# Patient Record
Sex: Female | Born: 1955 | Race: White | Hispanic: No | Marital: Married | Smoking: Former smoker
Health system: Southern US, Community
[De-identification: ages and names within clinical notes are randomized; demographics above are authoritative.]

---

## 2015-10-28 DIAGNOSIS — I10 Essential (primary) hypertension: Secondary | ICD-10-CM | POA: Insufficient documentation

## 2015-10-28 DIAGNOSIS — E785 Hyperlipidemia, unspecified: Secondary | ICD-10-CM | POA: Insufficient documentation

## 2015-10-28 DIAGNOSIS — G4700 Insomnia, unspecified: Secondary | ICD-10-CM | POA: Insufficient documentation

## 2018-04-28 ENCOUNTER — Ambulatory Visit
Admission: RE | Admit: 2018-04-28 | Discharge: 2018-04-28 | Disposition: A | Discharge status: Home / Self Care | Payer: Self-pay | Source: Ambulatory Visit | Attending: Family Medicine | Admitting: Family Medicine

## 2018-04-28 ENCOUNTER — Other Ambulatory Visit: Payer: Self-pay | Admitting: Family Medicine

## 2018-04-28 ENCOUNTER — Ambulatory Visit
Admission: RE | Admit: 2018-04-28 | Discharge: 2018-04-28 | Disposition: A | Discharge status: Home / Self Care | Payer: No Typology Code available for payment source | Source: Ambulatory Visit | Attending: Family Medicine | Admitting: Family Medicine

## 2018-04-28 DIAGNOSIS — S43401A Unspecified sprain of right shoulder joint, initial encounter: Secondary | ICD-10-CM

## 2018-05-11 ENCOUNTER — Ambulatory Visit (INDEPENDENT_AMBULATORY_CARE_PROVIDER_SITE_OTHER): Payer: Worker's Compensation | Admitting: Physician Assistant

## 2018-05-11 DIAGNOSIS — M25511 Pain in right shoulder: Secondary | ICD-10-CM | POA: Diagnosis not present

## 2018-05-11 DIAGNOSIS — M792 Neuralgia and neuritis, unspecified: Secondary | ICD-10-CM

## 2018-05-11 MED ORDER — METHYLPREDNISOLONE ACETATE 40 MG/ML IJ SUSP
40.0000 mg | INTRAMUSCULAR | Status: AC | PRN
  Administered 2018-05-11: 40 mg via INTRA_ARTICULAR

## 2018-05-11 MED ORDER — LIDOCAINE HCL 1 % IJ SOLN
3.0000 mL | INTRAMUSCULAR | Status: AC | PRN
  Administered 2018-05-11: 3 mL

## 2018-05-11 MED ORDER — GABAPENTIN 300 MG PO CAPS: 300.0000 mg | ORAL_CAPSULE | Freq: Every day | ORAL | 1 refills | Status: DC

## 2018-05-11 NOTE — Progress Notes (Signed)
Office Visit Note   Patient: Kimberly Marshall           Date of Birth: 17-Apr-1956           MRN: [id number] Visit Date: 05/11/2018              Requested by: Richmond CampbellKaplan, Kristen W., PA-C [address] [place], [place] [id number] PCP: Patient, No Pcp Per   Assessment & Plan: Visit Diagnoses:  1. Acute pain of right shoulder   2. Radicular pain in right arm     Plan: She is given shoulder exercise handouts knees are reviewed with her today.  We will have her undergo EMG nerve conduction studies to rule out brachial plexus injury or radial nerve injury.  Have her take Aleve 2 tablets in the morning 2 tablets in the evening with food.  We will start her on Neurontin due to the neuropathic pain she is having paresthesia of the right forearm.  Follow-up after the EMG nerve conduction studies to go over results and discuss further treatment.  Regards to work she can can continue work with the following restriction of no lifting the right arm greater than 5 pounds.  Follow-Up Instructions: Return for after EMG nerve conduction studies.   Orders:  Orders Placed This Encounter  Procedures  . Large Joint Inj: R subacromial bursa   Meds ordered this encounter  Medications  . gabapentin (NEURONTIN) 300 MG capsule    Sig: Take 1 capsule (300 mg total) by mouth at bedtime.    Dispense:  30 capsule    Refill:  1      Procedures: Large Joint Inj: R subacromial bursa on 05/11/2018 3:11 PM Indications: pain Details: 22 G 1.5 in needle, lateral approach  Arthrogram: No  Medications: 3 mL lidocaine 1 %; 40 mg methylPREDNISolone acetate 40 MG/ML Outcome: tolerated well, no immediate complications Procedure, treatment alternatives, risks and benefits explained, specific risks discussed. Consent was given by the patient. Patient was prepped and draped in the usual sterile fashion.       Clinical Data: No additional findings.   Subjective: Chief Complaint  Patient presents with  . Right  Shoulder - Pain    Injured shoulder 04/09/18 while lifting something at work.    HPI Kimberly Marshall is a 62 year old female workman's comp patient who works at [employer].  She reports on 04/09/2018 she was at work and lifted a 50 pound bag of line per customer initially had some pain in her shoulder and thought this would go away within a week later she started developing some numbness burning sensation in her forearm and down into her right thumb.  She is right-hand dominant.  Saw her primary care physician who placed her on a Medrol Dosepak which gave her no relief.  She also had radiographs of her right shoulder to have is able to review today and showed no acute fractures: Shoulder to be well located.  No bony abnormalities.  She is also tried some tramadol which helps some with the pain.  She notes that whenever she is trying to write that her thumb does not seem as strong as it has in the past and she has difficulty writing.  She also notes that she has pain in the shoulder particularly with trying to do any overhead activities.  Right arm with full extension causes the numbness to be worse anterior radial side of the forearm down into the thumb.  Review of Systems See HPI  Objective:  Vital Signs: There were no vitals taken for this visit.  Physical Exam  Constitutional: She is oriented to person, place, and time. She appears well-developed and well-nourished. No distress.  Cardiovascular: Intact distal pulses.  Pulmonary/Chest: Effort normal.  Neurological: She is alert and oriented to person, place, and time.  Skin: Skin is warm and dry. No rash noted. She is not diaphoretic. No erythema.  Psychiatric: She has a normal mood and affect.    Ortho Exam Bilateral shoulders she has 5 out of 5 strength with external and internal rotation.  Right shoulder intermittent.  She is able to bring her right arm above her head but has to ratchet it up.  No tenderness about the shoulder girdle including  the medial border of the right scapula.  Nontender bicipital groove no sign of biceps rupture.  Distal biceps is intact bilaterally.  She has 5 out of 5 strength throughout the upper extremities except for pinch test which is 4 out of 5 on the right compared to 5 out of 5 on the left.  She has subjective decreased sensation in the radial anterior aspect of the forearm to light touch.  Full motor bilateral hands and full sensation bilateral hands light touch.  Radial pulses are intact bilaterally.  Cervical spine she has full range of motion cervical spine without pain.  Nontender about the cervical spinal column paraspinous region.  Negative Spurling's.  Specialty Comments:  No specialty comments available.  Imaging: No results found.   PMFS History: Patient Active Problem List   Diagnosis Date Noted  . Acute pain of right shoulder 05/11/2018  . Radicular pain in right arm 05/11/2018    Social History   Occupational History  . Not on file  Tobacco Use  . Smoking status: Not on file  Substance and Sexual Activity  . Alcohol use: Not on file  . Drug use: Not on file  . Sexual activity: Not on file

## 2018-05-12 ENCOUNTER — Other Ambulatory Visit (INDEPENDENT_AMBULATORY_CARE_PROVIDER_SITE_OTHER): Payer: Self-pay

## 2018-05-12 DIAGNOSIS — M25511 Pain in right shoulder: Secondary | ICD-10-CM

## 2018-05-16 ENCOUNTER — Telehealth (INDEPENDENT_AMBULATORY_CARE_PROVIDER_SITE_OTHER): Payer: Self-pay | Admitting: Physician Assistant

## 2018-05-16 NOTE — Telephone Encounter (Signed)
Advised case mgr. I had faxed all info to her this morning but she says it takes 24 hrs to get her faxes. Adv her that Dr, Alvester Morin could do nerve study and injection. She will s/w adj and get back to me.

## 2018-05-16 NOTE — Telephone Encounter (Signed)
.  Kimberly Marshall

## 2018-05-16 NOTE — Telephone Encounter (Signed)
Kimberly Marshall, Nurse Case Manager with [employer] left a message in regards to this patient regarding the nerve testing Kimberly Marshall wants done.  She is requesting a VO for the testing so that they can get her scheduled.  CB#[phone].  Thank you.

## 2018-05-23 ENCOUNTER — Encounter: Payer: Self-pay | Admitting: Physician Assistant

## 2018-05-29 ENCOUNTER — Ambulatory Visit (INDEPENDENT_AMBULATORY_CARE_PROVIDER_SITE_OTHER): Payer: Worker's Compensation | Admitting: Physician Assistant

## 2018-05-29 ENCOUNTER — Encounter (INDEPENDENT_AMBULATORY_CARE_PROVIDER_SITE_OTHER): Payer: Self-pay | Admitting: Physician Assistant

## 2018-05-29 DIAGNOSIS — R202 Paresthesia of skin: Secondary | ICD-10-CM | POA: Insufficient documentation

## 2018-05-29 NOTE — Progress Notes (Signed)
Office Visit Note   Patient: Kimberly Marshall           Date of Birth: February 21, 1956           MRN: [id number] Visit Date: 05/29/2018              Requested by: No referring provider defined for this encounter. PCP: Patient, No Pcp Per   Assessment & Plan: Visit Diagnoses:  1. Paresthesia of arm     Plan: Due to her continued pain and weakness in the right hand especially thumb with a normal electrodiagnostic study recommend that she be seen by neurology.  Follow-Up Instructions: Return if symptoms worsen or fail to improve.   Orders:  No orders of the defined types were placed in this encounter.  No orders of the defined types were placed in this encounter.     Procedures: No procedures performed   Clinical Data: No additional findings.   Subjective: Chief Complaint  Patient presents with  . Right Shoulder - Pain    HPI Ms. [name] returns today to review her electrodiagnostic study of her right upper arm.  Again this is a workman's comp case.  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  She continues to have pain in the anterior aspect of her forearm into her thumb she describes a burning pain.  States overall her shoulder feels better since doing exercises in also having any subacromial injection.  She continues to have decreased sensation over the volar aspect of her forearm into the thumb to light touch.  She states that she is now able to raise her right arm above her head easily.  However if her right forearm is hanging down her pain in the forearm and thumb overall increase.  She did start taking gabapentin and actually increased it to twice a day on her own and feels that this is helped some with the pain in her arm. Electrodiagnostic testing of the right upper extremity showed no evidence of radiculopathy, plexopathy or radicular neuropathy.  Review of Systems Please see HPI otherwise negative  Objective: Vital  Signs: There were no vitals taken for this visit.  Physical Exam  Constitutional: She is oriented to person, place, and time. She appears well-developed and well-nourished. No distress.  Pulmonary/Chest: Effort normal.  Neurological: She is alert and oriented to person, place, and time.  Skin: She is not diaphoretic.  Psychiatric: She has a normal mood and affect.    Ortho Exam Bilateral shoulders 5 out of 5 strength with external and internal rotation against resistance.  Slight positive impingement test on the right, negative on the left.  Nontender tender about the right shoulder girdle.  Empty can test negative bilaterally.  Full motor bilateral hands.  4 out of 5 pinch strength on the right compared to 5 out of 5 on the left.  Decreased sensation subjectively right distal volar forearm medial aspect.  Negative Tinel's at the wrist and compression test over the median nerve bilaterally.  Full range of motion of the cervical spine without pain. Specialty Comments:  No specialty comments available.  Imaging: No results found.   PMFS History: Patient Active Problem List   Diagnosis Date Noted  . Paresthesia of arm 05/29/2018  . Acute pain of right shoulder 05/11/2018  . Radicular pain in right arm 05/11/2018   History reviewed. No pertinent past medical history.  History reviewed. No pertinent family history.  History  reviewed. No pertinent surgical history. Social History   Occupational History  . Not on file  Tobacco Use  . Smoking status: Never Smoker  . Smokeless tobacco: Never Used  Substance and Sexual Activity  . Alcohol use: Not on file  . Drug use: Not on file  . Sexual activity: Not on file

## 2018-05-30 ENCOUNTER — Other Ambulatory Visit (INDEPENDENT_AMBULATORY_CARE_PROVIDER_SITE_OTHER): Payer: Self-pay

## 2018-05-30 DIAGNOSIS — M79601 Pain in right arm: Secondary | ICD-10-CM

## 2018-06-02 ENCOUNTER — Telehealth (INDEPENDENT_AMBULATORY_CARE_PROVIDER_SITE_OTHER): Payer: Self-pay | Admitting: Physician Assistant

## 2018-06-02 NOTE — Telephone Encounter (Signed)
Holding for you.  Per your last note, patient increased her dose of Gabapentin on her own.  She is being referred to neurology.

## 2018-06-02 NOTE — Telephone Encounter (Signed)
Patient called stating that Kimberly Marshall was suppose to call in a prescription for Gabapentin due to the fact that he wanted her to increase her dosage from 1 to pills at night.  She stated that she has a few, but will run out.  CB#[phone]

## 2018-06-05 ENCOUNTER — Other Ambulatory Visit (INDEPENDENT_AMBULATORY_CARE_PROVIDER_SITE_OTHER): Payer: Self-pay | Admitting: Physician Assistant

## 2018-06-05 MED ORDER — GABAPENTIN 300 MG PO CAPS: 300.0000 mg | ORAL_CAPSULE | Freq: Two times a day (BID) | ORAL | 1 refills | Status: DC

## 2018-06-05 NOTE — Telephone Encounter (Signed)
I called patient and advised. 

## 2018-06-05 NOTE — Telephone Encounter (Signed)
Sent rx in

## 2018-06-12 ENCOUNTER — Telehealth (INDEPENDENT_AMBULATORY_CARE_PROVIDER_SITE_OTHER): Payer: Self-pay

## 2018-06-12 NOTE — Telephone Encounter (Signed)
  Received the following email from the case mgr regarding the neurologist referral. Please advise  I have called around within 100 miles to find a Neurologist for Kimberly Marshall. I have not been able to find one who will take workers compensation. I think at this point your office is going to have to have Richardean Canal talk to one of the Neurologists you normally refer to and see if they will take work comp or see if you can get someone from the hospital neurology clinic to see her under work comp or she is going to have to come back to you. I called [facility] and faxed them her records last week and I spoke with them this morning and was advised they will only see work comp for headaches and that the hospital doesn't even take work comp for Neurology. I find that hard to believe. I will follow-up with [name] this morning and let her know she is going to have to get back with you all about this but please let me know how you want to proceed.  Zara Council, RN, CCM Nurse Case Manager [employer]  [address] [address] [place], [place] [address] P [phone] ext. [phone] F [phone] [email or web address]

## 2018-06-12 NOTE — Telephone Encounter (Signed)
Please advise. Thanks.  

## 2018-06-13 NOTE — Telephone Encounter (Signed)
Per Gil's response, work comp needs to find neurologist.

## 2018-06-13 NOTE — Telephone Encounter (Signed)
Workers comp needs to find her a Insurance account manager

## 2018-06-14 NOTE — Telephone Encounter (Signed)
Case mgr is asking if she can get an order for an MRI since she hasn't had one yet. If her MRI is normal then they will request an IME because finding a Neurologist was attempted and none will take workers Sport and exercise psychologist.

## 2018-06-16 ENCOUNTER — Other Ambulatory Visit (INDEPENDENT_AMBULATORY_CARE_PROVIDER_SITE_OTHER): Payer: Self-pay | Admitting: Radiology

## 2018-06-16 DIAGNOSIS — M542 Cervicalgia: Secondary | ICD-10-CM

## 2018-06-16 NOTE — Telephone Encounter (Signed)
Emailed orders to [employer] ([email or web address])

## 2018-06-16 NOTE — Telephone Encounter (Signed)
Can you see if Bronson Curb will address the previous message next week

## 2018-06-16 NOTE — Telephone Encounter (Signed)
Spoke with Dr. Magnus Ivan he said MRI her cervical spine to r/o HNP and her shoulder to evaluate internal derangement

## 2018-06-22 ENCOUNTER — Telehealth (INDEPENDENT_AMBULATORY_CARE_PROVIDER_SITE_OTHER): Payer: Self-pay | Admitting: Orthopaedic Surgery

## 2018-06-22 NOTE — Telephone Encounter (Signed)
Inetta Fermo from [facility] called stating that she received the same order back.  She stated that the order needs to be either signed and faxed or electronically signed and faxed.  Please fax directly to her at [phone].  CB#[phone]  Thank you.

## 2018-06-23 NOTE — Telephone Encounter (Signed)
Orders signature stamped and faxed to number given

## 2018-06-23 NOTE — Telephone Encounter (Signed)
I think this may be for you. 

## 2018-07-03 ENCOUNTER — Telehealth (INDEPENDENT_AMBULATORY_CARE_PROVIDER_SITE_OTHER): Payer: Self-pay

## 2018-07-03 ENCOUNTER — Encounter (INDEPENDENT_AMBULATORY_CARE_PROVIDER_SITE_OTHER): Payer: Self-pay | Admitting: Physician Assistant

## 2018-07-03 ENCOUNTER — Ambulatory Visit (INDEPENDENT_AMBULATORY_CARE_PROVIDER_SITE_OTHER): Payer: Worker's Compensation | Admitting: Physician Assistant

## 2018-07-03 VITALS — Ht 65.0 in | Wt 172.0 lb

## 2018-07-03 DIAGNOSIS — S46011D Strain of muscle(s) and tendon(s) of the rotator cuff of right shoulder, subsequent encounter: Secondary | ICD-10-CM | POA: Diagnosis not present

## 2018-07-03 DIAGNOSIS — R202 Paresthesia of skin: Secondary | ICD-10-CM

## 2018-07-03 NOTE — Progress Notes (Signed)
Office Visit Note   Patient: Kimberly Marshall           Date of Birth: [birth year]           MRN: [id number] Visit Date: 07/03/2018              Requested by: No referring provider defined for this encounter. PCP: Patient, No Pcp Per   Assessment & Plan: Visit Diagnoses:  1. Traumatic complete tear of right rotator cuff, subsequent encounter   2. Cervicalgia     Plan: Due to patient's continued pain in the right shoulder and the MRI findings recommend right shoulder arthroscopy with possible rotator cuff repair.  Discussed with her risk benefits of surgery which include but are not limited to prolonged pain, worsening pain, inability to repair the rotator cuff tear, infection, wound healing problems, nerve or vessel injury.  She would like to proceed with surgery in the near future we will have to obtain approval from Workmen's Comp. prior to scheduling her surgery.  In regards to her neck due to the fact that she had normal EMG nerve conduction studies and has mild right foraminal narrowing at C6-C7 with recommend foraminal injection on the right at C6-C7 for diagnostic and hopefully therapeutic purposes.  Again this will need to be approved by [employer]. prior to scheduling.  Questions were encouraged and answered at length.  We will see her back 1 week postop.  Follow-Up Instructions: Return for Dr. Magnus Ivan one week post-op.   Orders:  No orders of the defined types were placed in this encounter.  No orders of the defined types were placed in this encounter.     Procedures: No procedures performed   Clinical Data: No additional findings.   Subjective: Chief Complaint  Patient presents with  . Right Shoulder - Follow-up  . Spine - Follow-up    W/C OTJI 04/09/18    HPI [name] returns today to go over the MRI of her right shoulder and cervical spine.  Again she is Workmen's Comp. she is status post injury 04/09/2018 to the right upper extremity.  She states that her  right shoulder pain is now returning.  She did have good relief after the cortisone injection at the initial office visit on 05/29/2018.  She continues to have numbness in the right forearm. MRI of the cervical spine is reviewed with patient.  Showed mild to moderate cervical spondylosis and facet joint arthrosis.  C3-4 small central disc protrusion without significant spinal or foraminal stenosis.  C6-C7 mild bilateral foraminal stenosis. MRI of the right shoulder showed a full-thickness supraspinatus tear with a 1.5 cm of retraction.  Mild AC joint degenerative changes.  Infraspinatus partial-thickness tearing.  Review of Systems   Objective: Vital Signs: Ht 5\' 5"  (1.651 m)   Wt 172 lb (78 kg)   BMI 28.62 kg/m   Physical Exam  Constitutional: She is oriented to person, place, and time. She appears well-developed and well-nourished. No distress.  Pulmonary/Chest: Effort normal.  Neurological: She is alert and oriented to person, place, and time.  Skin: She is not diaphoretic.  Psychiatric: She has a normal mood and affect.    Ortho Exam Positive impingement right shoulder.  Internal rotation against resistance 5 out of 5 strength bilaterally external rotation 4 of the right 5 out of 5 on the left against resistance.  Empty can test positive on the right negative on the left liftoff test is positive on the right negative on the left.  Continue subjective numbness right forearm volar thumb side. Specialty Comments:  No specialty comments available.  Imaging: No results found.   PMFS History: Patient Active Problem List   Diagnosis Date Noted  . Paresthesia of arm 05/29/2018  . Acute pain of right shoulder 05/11/2018  . Radicular pain in right arm 05/11/2018   No past medical history on file.  No family history on file.  No past surgical history on file. Social History   Occupational History  . Not on file  Tobacco Use  . Smoking status: Never Smoker  . Smokeless tobacco:  Never Used  Substance and Sexual Activity  . Alcohol use: Not on file  . Drug use: Not on file  . Sexual activity: Not on file

## 2018-07-03 NOTE — Telephone Encounter (Signed)
Emailed todays office note and surgery request to case mgr Zara Council @ [employer] per her request

## 2018-07-04 ENCOUNTER — Other Ambulatory Visit (INDEPENDENT_AMBULATORY_CARE_PROVIDER_SITE_OTHER): Payer: Self-pay

## 2018-07-04 DIAGNOSIS — M542 Cervicalgia: Secondary | ICD-10-CM

## 2018-07-13 ENCOUNTER — Telehealth (INDEPENDENT_AMBULATORY_CARE_PROVIDER_SITE_OTHER): Payer: Self-pay

## 2018-07-13 NOTE — Telephone Encounter (Signed)
I received notification from Francia Greaves the adjuster who has now been assigned to the claim that she is denying the claim going forward so the surgery and further treatment has not been approved. I will discuss it with Sheridyn so she can go to her manager and HR. I'll let you know if it gets reversed. Thanks.  Zara Council, RN, CCM Nurse Case Manager [employer]  [address] [address] [place], [place] [address] P [phone]. [phone] F [phone] [email or web address]

## 2018-07-17 ENCOUNTER — Other Ambulatory Visit (INDEPENDENT_AMBULATORY_CARE_PROVIDER_SITE_OTHER): Payer: Self-pay | Admitting: Physician Assistant

## 2018-07-17 NOTE — Telephone Encounter (Signed)
Please advise 

## 2018-07-28 ENCOUNTER — Telehealth (INDEPENDENT_AMBULATORY_CARE_PROVIDER_SITE_OTHER): Payer: Self-pay

## 2018-07-28 NOTE — Telephone Encounter (Signed)
Left voicemail asking that adjustor fax us a copy of the work comp denial letter

## 2018-08-02 ENCOUNTER — Other Ambulatory Visit (INDEPENDENT_AMBULATORY_CARE_PROVIDER_SITE_OTHER): Payer: Self-pay | Admitting: Orthopaedic Surgery

## 2018-08-02 ENCOUNTER — Telehealth (INDEPENDENT_AMBULATORY_CARE_PROVIDER_SITE_OTHER): Payer: Self-pay | Admitting: Orthopaedic Surgery

## 2018-08-02 NOTE — Telephone Encounter (Signed)
Please advise 

## 2018-08-02 NOTE — Telephone Encounter (Signed)
Please advise if this is ok.  

## 2018-08-02 NOTE — Telephone Encounter (Signed)
Patient called advised her employer need a note stating she can not work. Patient said the last day that she worked was Monday 07/31/18. Patient said she can not work with her right shoulder  Hurting as bad as it is. Patient said she work for [employer] company in [place].  Patient said she will pick up note tomorrow. The number to contact patient is [phone]

## 2018-08-02 NOTE — Telephone Encounter (Signed)
It is okay to give her a note to keep her out of work due to her shoulder pain.  Apparently we have not seen her since September.  However she finally did get an MRI of her shoulder and cervical spine that was done in October but for some reason she has not been seen in follow-up since then.  She needs a follow-up appointment so we can go over the MRI of her shoulder and her cervical spine.

## 2018-08-03 ENCOUNTER — Telehealth (INDEPENDENT_AMBULATORY_CARE_PROVIDER_SITE_OTHER): Payer: Self-pay | Admitting: Orthopaedic Surgery

## 2018-08-03 ENCOUNTER — Encounter (INDEPENDENT_AMBULATORY_CARE_PROVIDER_SITE_OTHER): Payer: Self-pay

## 2018-08-03 NOTE — Telephone Encounter (Signed)
Patient walked in to check the status of the out of work note. Pt states she last saw Kimberly Marshall in October after the MRI & was scheduled to have surgery but W/C denied surgery. Then it was decided to have the sx using her medical insurance.   Pt request a call  Back at [phone]

## 2018-08-03 NOTE — Telephone Encounter (Signed)
Patient aware note is at front desk 

## 2018-08-14 ENCOUNTER — Telehealth (INDEPENDENT_AMBULATORY_CARE_PROVIDER_SITE_OTHER): Payer: Self-pay | Admitting: Orthopaedic Surgery

## 2018-08-14 ENCOUNTER — Other Ambulatory Visit (INDEPENDENT_AMBULATORY_CARE_PROVIDER_SITE_OTHER): Payer: Self-pay | Admitting: Orthopaedic Surgery

## 2018-08-14 MED ORDER — OXYCODONE HCL 5 MG PO CAPS: 5.0000 mg | ORAL_CAPSULE | ORAL | 0 refills | Status: AC | PRN

## 2018-08-14 NOTE — Telephone Encounter (Signed)
[facility]

## 2018-08-14 NOTE — Telephone Encounter (Signed)
Her surgery is scheduled for 08-17-18 at [facility].

## 2018-08-14 NOTE — Telephone Encounter (Signed)
Find out when the surgery is going to be first.

## 2018-08-14 NOTE — Telephone Encounter (Signed)
Patient wanted to know if she could get her pain medication called into her pharmacy before she has the surgery so that she can go ahead and pick it up instead of waiting til the day of surgery.  CB#[phone].  Thank you.

## 2018-08-14 NOTE — Telephone Encounter (Signed)
when's her surgery?

## 2018-08-14 NOTE — Telephone Encounter (Signed)
Please advise 

## 2018-08-14 NOTE — Telephone Encounter (Signed)
I sent some oxycodone in to the [facility] in [place].

## 2018-08-17 DIAGNOSIS — S46011D Strain of muscle(s) and tendon(s) of the rotator cuff of right shoulder, subsequent encounter: Secondary | ICD-10-CM

## 2018-08-21 ENCOUNTER — Other Ambulatory Visit (INDEPENDENT_AMBULATORY_CARE_PROVIDER_SITE_OTHER): Payer: Self-pay | Admitting: Orthopaedic Surgery

## 2018-08-21 NOTE — Telephone Encounter (Signed)
Please advise 

## 2018-08-24 ENCOUNTER — Ambulatory Visit (INDEPENDENT_AMBULATORY_CARE_PROVIDER_SITE_OTHER): Payer: No Typology Code available for payment source | Admitting: Physician Assistant

## 2018-08-24 ENCOUNTER — Encounter (INDEPENDENT_AMBULATORY_CARE_PROVIDER_SITE_OTHER): Payer: Self-pay | Admitting: Physician Assistant

## 2018-08-24 DIAGNOSIS — M542 Cervicalgia: Secondary | ICD-10-CM

## 2018-08-24 MED ORDER — GABAPENTIN 300 MG PO CAPS: 300.0000 mg | ORAL_CAPSULE | Freq: Two times a day (BID) | ORAL | 1 refills | Status: DC

## 2018-08-24 NOTE — Progress Notes (Signed)
HPI: [name] returns today 1 week status post right shoulder arthroscopy.  She underwent a subacromial decompression with partial acromioplasty without coracoid acromial release on the right side.  There was a small tear of the leading edge of the supraspinatus tendon but this was not repaired due to the fact there was not a full-thickness.  States that she is not having much pain in the right shoulder at this point time. Patient continues to have numbness in her right thumb.  Again she underwent a EMG nerve conduction studies upper extremities which were normal.  MRI of her cervical spine showed showed mild bilateral foraminal stenosis at C6-C7.  Physical exam right shoulder: Port sites are well approximated with nylon sutures no signs of infection.  She has good range of motion of the right elbow forearm wrist and hand.  Impression: Status post right shoulder arthroscopy 1 week Paresthesia right thumb  Plan: This point time patient is filing everything under her personal insurance therefore I recommend again for diagnostic and hopefully therapeutic purposes right C6-C7 foraminal injection with Dr. Alvester MorinNewton.  We will see her back in a month in regards to her shoulder and also to see what type of response she had to the epidural steroid injection with Dr. Alvester MorinNewton.  Sutures were removed from the shoulder today.  She will get the incisions wet.  She is given prescription for physical therapy to work on range of motion of the shoulder and strengthening shoulder.

## 2018-08-29 ENCOUNTER — Telehealth (INDEPENDENT_AMBULATORY_CARE_PROVIDER_SITE_OTHER): Payer: Self-pay | Admitting: Orthopaedic Surgery

## 2018-08-29 ENCOUNTER — Telehealth (INDEPENDENT_AMBULATORY_CARE_PROVIDER_SITE_OTHER): Payer: Self-pay | Admitting: *Deleted

## 2018-08-29 NOTE — Telephone Encounter (Signed)
08/17/18 [facility] [phone]

## 2018-09-14 ENCOUNTER — Other Ambulatory Visit (INDEPENDENT_AMBULATORY_CARE_PROVIDER_SITE_OTHER): Payer: Self-pay | Admitting: Physical Medicine and Rehabilitation

## 2018-09-14 DIAGNOSIS — F411 Generalized anxiety disorder: Secondary | ICD-10-CM

## 2018-09-14 MED ORDER — DIAZEPAM 5 MG PO TABS: ORAL_TABLET | ORAL | 0 refills | Status: DC

## 2018-09-14 NOTE — Progress Notes (Signed)
Pre-procedure diazepam ordered for pre-operative anxiety.  

## 2018-09-14 NOTE — Telephone Encounter (Signed)
Sent rx.

## 2018-09-21 ENCOUNTER — Encounter (INDEPENDENT_AMBULATORY_CARE_PROVIDER_SITE_OTHER): Payer: Self-pay | Admitting: Orthopaedic Surgery

## 2018-09-21 ENCOUNTER — Ambulatory Visit (INDEPENDENT_AMBULATORY_CARE_PROVIDER_SITE_OTHER): Payer: 59 | Admitting: Orthopaedic Surgery

## 2018-09-21 DIAGNOSIS — Z9889 Other specified postprocedural states: Secondary | ICD-10-CM

## 2018-09-21 NOTE — Progress Notes (Signed)
The patient is now about 6-week status post a right shoulder arthroscopy with extensive debridement and subacromial decompression.  We did find a tear of the rotator cuff at the anterior leading edge of the rotator cuff and we did not need to repair it.  At the time of surgery we found mainly just significant inflamed tissue in the shoulder itself but the majority the rotator cuff was intact and not in need of repair.  She is actually scheduled to have an intervention in her cervical spine under fluoroscopy by Dr. Alvester Morin tomorrow.  She is working with physical therapy trying to progress her shoulder strength.  She is out of work until further notice due to this being her dominant arm and the disability that is caused temporarily.  On exam she still has significant weakness and decreased motion of the right shoulder.  We will still need to keep her out of work until further notice.  She will continue aggressive physical therapy on her right shoulder.  She will have her intervention in the cervical spine by Dr. Alvester Morin tomorrow.  We will see her back in 4 weeks to see how she is doing overall.

## 2018-09-22 ENCOUNTER — Ambulatory Visit (INDEPENDENT_AMBULATORY_CARE_PROVIDER_SITE_OTHER): Payer: Self-pay

## 2018-09-22 ENCOUNTER — Ambulatory Visit (INDEPENDENT_AMBULATORY_CARE_PROVIDER_SITE_OTHER): Payer: 59 | Admitting: Physical Medicine and Rehabilitation

## 2018-09-22 ENCOUNTER — Encounter (INDEPENDENT_AMBULATORY_CARE_PROVIDER_SITE_OTHER): Payer: Self-pay | Admitting: Physical Medicine and Rehabilitation

## 2018-09-22 VITALS — BP 130/79 | HR 75 | Temp 97.8°F

## 2018-09-22 DIAGNOSIS — M5412 Radiculopathy, cervical region: Secondary | ICD-10-CM | POA: Diagnosis not present

## 2018-09-22 MED ORDER — METHYLPREDNISOLONE ACETATE 80 MG/ML IJ SUSP
80.0000 mg | Freq: Once | INTRAMUSCULAR | Status: AC
  Administered 2018-09-22: 80 mg

## 2018-09-22 NOTE — Patient Instructions (Signed)

## 2018-09-22 NOTE — Progress Notes (Signed)
.  Numeric Pain Rating Scale and Functional Assessment Average Pain 3   In the last MONTH (on 0-10 scale) has pain interfered with the following?  1. General activity like being  able to carry out your everyday physical activities such as walking, climbing stairs, carrying groceries, or moving a chair?  Rating(4)   +Driver, -BT, -Dye Allergies.  

## 2018-09-25 NOTE — Progress Notes (Signed)
Kimberly ChandlerLynn Som - 63 y.o. female MRN [id number]  Date of birth: 1956/08/16  Office Visit Note: Visit Date: 09/22/2018 PCP: Patient, No Pcp Per Referred by: No ref. provider found  Subjective: Chief Complaint  Patient presents with  . Neck - Pain  . Right Arm - Pain  . Right Hand - Numbness, Tingling   HPI: Kimberly Marshall is a 63 y.o. female who comes in today At the request of Rexene EdisonGil Clark, PA-C for C7-T1 interlaminar epidural steroid injection for right radicular pain.  Patient has been having neck and shoulder pain with referring pattern of the right arm down to the right thumb and somewhat of a C6 distribution.  She had electrodiagnostic study performed by Dr. Manon HildingBodea which was fairly normal without sign of acute radiculopathy or other nerve compression.  Symptoms started August 2019.  Has been taking medications including gabapentin.  Reports average pain is a 3 out of 10.  MRI showed a small central protrusion at C3-4 and spondylosis and mild foraminal narrowing at C6-7.  Bronson CurbGil had talked about C6-7 foraminal injection but her symptoms are clearly more C6 distributed in her thumb.  Nerve root at C6-7 would actually be the C7 nerve root.  Discussed with the patient the risk the benefit of nerve root block in the cervical spine and I do not feel like that is a warranted wrist to take at this point.  I do think epidural injection that would be worthwhile and she does want to proceed.  ROS Otherwise per HPI.  Assessment & Plan: Visit Diagnoses:  1. Cervical radiculopathy     Plan: No additional findings.   Meds & Orders:  Meds ordered this encounter  Medications  . methylPREDNISolone acetate (DEPO-MEDROL) injection 80 mg    Orders Placed This Encounter  Procedures  . XR C-ARM NO REPORT  . Epidural Steroid injection    Follow-up: Return if symptoms worsen or fail to improve, for Consider C6 transforaminal nerve root block.   Procedures: No procedures performed  Cervical Epidural Steroid  Injection - Interlaminar Approach with Fluoroscopic Guidance  Patient: Kimberly Marshall      Date of Birth: 1956/08/16 MRN: [id number] PCP: Patient, No Pcp Per      Visit Date: 09/22/2018   Universal Protocol:    Date/Time: 01/13/205:35 AM  Consent Given By: the patient  Position: PRONE  Additional Comments: Vital signs were monitored before and after the procedure. Patient was prepped and draped in the usual sterile fashion. The correct patient, procedure, and site was verified.   Injection Procedure Details:  Procedure Site One Meds Administered:  Meds ordered this encounter  Medications  . methylPREDNISolone acetate (DEPO-MEDROL) injection 80 mg     Laterality: Right  Location/Site: C7-T1  Needle size: 20 G  Needle type: Touhy  Needle Placement: Paramedian epidural space  Findings:  -Comments: Excellent flow of contrast into the epidural space.  Procedure Details: Using a paramedian approach from the side mentioned above, the region overlying the inferior lamina was localized under fluoroscopic visualization and the soft tissues overlying this structure were infiltrated with 4 ml. of 1% Lidocaine without Epinephrine. A # 20 gauge, Tuohy needle was inserted into the epidural space using a paramedian approach.  The epidural space was localized using loss of resistance along with lateral and contralateral oblique bi-planar fluoroscopic views.  After negative aspirate for air, blood, and CSF, a 2 ml. volume of Isovue-250 was injected into the epidural space and the flow of contrast was observed. Radiographs  were obtained for documentation purposes.   The injectate was administered into the level noted above.  Additional Comments:  The patient tolerated the procedure well Dressing: Band-Aid    Post-procedure details: Patient was observed during the procedure. Post-procedure instructions were reviewed.  Patient left the clinic in stable condition.    Clinical  History: Electrodiagnostic study of the right upper limb performed at [facility] pain management by Dr. Wilhelmenia Blase showed normal electrodiagnostic study.  2019  Acute Interface, Incoming Rad Results - 06/28/2018 12:42 PM EDT MRI cervical spine without contrast:  INDICATION: Neck pain and right shoulder pain. Right arm weakness..  TECHNIQUE: Sagittal T1, T2, and STIR images were performed. Axial T2-weighted images.   COMPARISON: None available  FINDINGS: # Vertebral bodies: Normal height and alignment.  # Marrow signal: No significant abnormality. # Cervical spinal cord: No mass or abnormal signal intensity. No spinal cord compression. # Craniovertebral junction: Normal.  #   # C2-3: Normal. # C3-4: Mild degenerative disc disease with small uncovertebral joint spurs. Small central disc protrusion. Mild impingement on the thecal sac. Mild facet joint arthritis. No significant spinal or foraminal stenosis. # C4-5: Mild degenerative disc disease. Mild facet joint arthritis. No significant spinal or foraminal stenosis. # C5-6: Mild degenerative disc disease. No significant spinal or foraminal stenosis. # C6-7: Moderate degenerative disc disease. Mild diffuse posterior disc osteophyte complex. Mild bilateral foraminal stenosis. # C7-T1: Mild degenerative disc disease. No spinal or foraminal stenosis.   IMPRESSION:  1. Mild to moderate cervical spondylosis and facet joint arthrosis.  2. Small central disc protrusion C3-4. 3. 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   She reports that she has never smoked. She has never used smokeless tobacco. No results for input(s): HGBA1C, LABURIC in the last 8760 hours.  Objective:  VS:  HT:    WT:   BMI:     BP:130/79  HR:75bpm  TEMP:97.8 F (36.6 C)(Oral)  RESP:  Physical Exam  Ortho Exam Imaging: No results found.  Past Medical/Family/Surgical/Social History: Medications & Allergies reviewed per EMR, new medications updated. Patient  Active Problem List   Diagnosis Date Noted  . Status post arthroscopy of right shoulder 09/21/2018  . Paresthesia of arm 05/29/2018  . Acute pain of right shoulder 05/11/2018  . Radicular pain in right arm 05/11/2018   History reviewed. No pertinent past medical history. History reviewed. No pertinent family history. History reviewed. No pertinent surgical history. Social History   Occupational History  . Not on file  Tobacco Use  . Smoking status: Never Smoker  . Smokeless tobacco: Never Used  Substance and Sexual Activity  . Alcohol use: Not on file  . Drug use: Not on file  . Sexual activity: Not on file

## 2018-09-25 NOTE — Procedures (Signed)
Cervical Epidural Steroid Injection - Interlaminar Approach with Fluoroscopic Guidance  Patient: Kimberly Marshall      Date of Birth: Apr 18, 1956 MRN: [id number] PCP: Patient, No Pcp Per      Visit Date: 09/22/2018   Universal Protocol:    Date/Time: 01/13/205:35 AM  Consent Given By: the patient  Position: PRONE  Additional Comments: Vital signs were monitored before and after the procedure. Patient was prepped and draped in the usual sterile fashion. The correct patient, procedure, and site was verified.   Injection Procedure Details:  Procedure Site One Meds Administered:  Meds ordered this encounter  Medications  . methylPREDNISolone acetate (DEPO-MEDROL) injection 80 mg     Laterality: Right  Location/Site: C7-T1  Needle size: 20 G  Needle type: Touhy  Needle Placement: Paramedian epidural space  Findings:  -Comments: Excellent flow of contrast into the epidural space.  Procedure Details: Using a paramedian approach from the side mentioned above, the region overlying the inferior lamina was localized under fluoroscopic visualization and the soft tissues overlying this structure were infiltrated with 4 ml. of 1% Lidocaine without Epinephrine. A # 20 gauge, Tuohy needle was inserted into the epidural space using a paramedian approach.  The epidural space was localized using loss of resistance along with lateral and contralateral oblique bi-planar fluoroscopic views.  After negative aspirate for air, blood, and CSF, a 2 ml. volume of Isovue-250 was injected into the epidural space and the flow of contrast was observed. Radiographs were obtained for documentation purposes.   The injectate was administered into the level noted above.  Additional Comments:  The patient tolerated the procedure well Dressing: Band-Aid    Post-procedure details: Patient was observed during the procedure. Post-procedure instructions were reviewed.  Patient left the clinic in stable  condition.

## 2018-10-19 ENCOUNTER — Encounter (INDEPENDENT_AMBULATORY_CARE_PROVIDER_SITE_OTHER): Payer: Self-pay | Admitting: Orthopaedic Surgery

## 2018-10-19 ENCOUNTER — Ambulatory Visit (INDEPENDENT_AMBULATORY_CARE_PROVIDER_SITE_OTHER): Payer: 59 | Admitting: Orthopaedic Surgery

## 2018-10-19 DIAGNOSIS — M792 Neuralgia and neuritis, unspecified: Secondary | ICD-10-CM

## 2018-10-19 DIAGNOSIS — M542 Cervicalgia: Secondary | ICD-10-CM

## 2018-10-19 DIAGNOSIS — Z9889 Other specified postprocedural states: Secondary | ICD-10-CM

## 2018-10-19 DIAGNOSIS — R202 Paresthesia of skin: Secondary | ICD-10-CM

## 2018-10-19 MED ORDER — METHYLPREDNISOLONE 4 MG PO TABS: ORAL_TABLET | ORAL | 0 refills | Status: DC

## 2018-10-19 MED ORDER — DICLOFENAC SODIUM 75 MG PO TBEC: 75.0000 mg | DELAYED_RELEASE_TABLET | Freq: Two times a day (BID) | ORAL | 3 refills | Status: AC | PRN

## 2018-10-19 NOTE — Progress Notes (Signed)
The patient is continue to follow-up status post a right shoulder arthroscopy with significant debridement subacromial decompression.  There was not any significant rotator cuff tear the need to be repaired.  She has had significant relief of her symptoms of her shoulder.  However she still complains of pain that is radicular nature going down her right arm that has not been relieved at all.  We actually sent her for a C7/T1 transforaminal injection by Dr. Alvester Morin and she said her symptoms are actually worse since then.  Her original injury occurred when she was lifting a 50 pound bag of dog food and it was work-related.  On exam she appears comfortable.  Her right arm moves normally.  Her shoulder is done much better overall.  She still has the pain in her forearm and proximal arm as well as hand.  Nerve conduction studies have been normal.  An MRI showed mild foraminal stenosis bilaterally at C7-T1.  I am at a loss of what else to try for her.  It does not sound like she needs any type of surgical intervention in her neck based on it was just mild foraminal stenosis.  I am concerned for the fact that the injection did not help her at all.  I want to have her try a 6-day steroid taper as well as diclofenac as an anti-inflammatory.  She continue Neurontin and will continue physical therapy.  We will will eventually need to potentially obtain a functional capacity evaluation after some work conditioning once we get her through the surgery from her shoulder.  I do not see her going back to work right now based on her continued right-sided radicular symptoms.

## 2018-10-24 ENCOUNTER — Other Ambulatory Visit (INDEPENDENT_AMBULATORY_CARE_PROVIDER_SITE_OTHER): Payer: Self-pay | Admitting: Physician Assistant

## 2018-10-24 NOTE — Telephone Encounter (Signed)
Please advise 

## 2018-11-06 ENCOUNTER — Encounter (INDEPENDENT_AMBULATORY_CARE_PROVIDER_SITE_OTHER): Payer: Self-pay | Admitting: Orthopaedic Surgery

## 2018-11-06 ENCOUNTER — Ambulatory Visit (INDEPENDENT_AMBULATORY_CARE_PROVIDER_SITE_OTHER): Payer: 59 | Admitting: Orthopaedic Surgery

## 2018-11-06 DIAGNOSIS — Z9889 Other specified postprocedural states: Secondary | ICD-10-CM

## 2018-11-06 DIAGNOSIS — R202 Paresthesia of skin: Secondary | ICD-10-CM

## 2018-11-06 DIAGNOSIS — M25511 Pain in right shoulder: Secondary | ICD-10-CM

## 2018-11-06 DIAGNOSIS — M542 Cervicalgia: Secondary | ICD-10-CM

## 2018-11-06 NOTE — Progress Notes (Signed)
The patient is continue to follow-up from injuries she sustained a work-related accident from something that landed on her right shoulder when she is lifting overhead as well.  She continues to complain of paresthesias in her right arm.  We have been uncertain as to the etiology of the paresthesias.  She is not a diabetic.  She has had nerve conduction studies of the right upper extremity were normal.  She had an MRI of the cervical spine that just showed some mild bilateral stenosis at C7-T1.  She did have some partial tearing of rotator cuff of the right shoulder and impingement and we did perform an arthroscopic intervention in December and she said the shoulder is doing great with no issues at all.  She still has her neck issues.  Otherwise she feels like she is doing better.  Her right shoulder is moving easily in fluid and full without a issues.  Her incisions of healed nicely.  Her rotator cuff is strong.  Her neck moves well.  She has excellent strength in her bilateral upper extremities with subjective pain rating down her arm into her hand with numbness and tingling.  The mild loss of what else to try for her.  I will let her return to light duty work with restrictions of no repetitive overhead activities and lifting no greater than 10 to 15 pounds for the next 3 months.  I would like to reevaluate her one more time in 3 months to see if she is had any symptom improvement at all because hopefully things will be getting better for her.  All question concerns were answered and addressed.  I have no other options as it relates to her neck given the mild foraminal stenosis there is not a surgical intervention that she needs and the nerve conduction studies do not show any nerve compression in the spine.  Injections have not helped either.  We will see how she is doing in 3 months from now.

## 2018-11-22 ENCOUNTER — Telehealth (INDEPENDENT_AMBULATORY_CARE_PROVIDER_SITE_OTHER): Payer: Self-pay | Admitting: Orthopaedic Surgery

## 2018-11-22 NOTE — Telephone Encounter (Signed)
[facility] [employer] called checking status of a request for records. I advised we didn't have one and aske her to re fax and I would take care of when it came in. I gave both fax #'s

## 2019-01-01 ENCOUNTER — Telehealth (INDEPENDENT_AMBULATORY_CARE_PROVIDER_SITE_OTHER): Payer: Self-pay | Admitting: Orthopaedic Surgery

## 2019-01-01 NOTE — Telephone Encounter (Signed)
Billing faxed to Tammy @ [facility]

## 2019-01-11 ENCOUNTER — Other Ambulatory Visit: Payer: Self-pay

## 2019-01-11 ENCOUNTER — Ambulatory Visit (INDEPENDENT_AMBULATORY_CARE_PROVIDER_SITE_OTHER): Payer: 59 | Admitting: Internal Medicine

## 2019-01-11 ENCOUNTER — Encounter: Payer: Self-pay | Admitting: Internal Medicine

## 2019-01-11 VITALS — BP 130/70 | HR 84 | Ht 65.5 in | Wt 178.0 lb

## 2019-01-11 DIAGNOSIS — G4733 Obstructive sleep apnea (adult) (pediatric): Secondary | ICD-10-CM

## 2019-01-11 DIAGNOSIS — R0683 Snoring: Secondary | ICD-10-CM | POA: Diagnosis not present

## 2019-01-11 NOTE — Progress Notes (Signed)
01/11/2019- 59 yoF former smoker for slep evaluation referred by PCP Mady Gemma PA-C for sleep apnea; has not had sleep test done, states she started having what she thinks is sleep apnea in 1988, snores, stops breathing occasionally according to spouse, & daytime sleepiness, wakes 2-3 times. Medical problem list includes arthritis R shoulder, insomnia, HBP hypercholesterolemia Med list includes valium 5 mg, gabapentin 300 mg, oxycodone, trazodone 50 mg  Epworth score 8 No hx ENT surgery, lung, heart or thyroid problems.  Using trazodone to sleep and 1 cup morning coffee.  Prior to Admission medications   Medication Sig Start Date End Date Taking? Authorizing Provider  gabapentin (NEURONTIN) 300 MG capsule TAKE 1 CAPSULE(300 MG) BY MOUTH TWICE DAILY 10/24/18  Yes Kirtland Bouchard, PA-C  lisinopril-hydrochlorothiazide (PRINZIDE,ZESTORETIC) 20-25 MG tablet TK 1 T PO QD 06/15/18  Yes [provider]  metoprolol succinate (TOPROL-XL) 50 MG 24 hr tablet TK 1 T PO QD 03/17/18  Yes [provider]  pravastatin (PRAVACHOL) 20 MG tablet  04/04/18  Yes [provider]  traZODone (DESYREL) 50 MG tablet TK 1 TO 2 TS PO HS PRF SLP 04/04/18  Yes [provider]  diclofenac (VOLTAREN) 75 MG EC tablet Take 1 tablet (75 mg total) by mouth 2 (two) times daily between meals as needed. Patient not taking: Reported on 01/11/2019 10/19/18   Kathryne Hitch, MD  oxycodone (OXY-IR) 5 MG capsule Take 1 capsule (5 mg total) by mouth every 4 (four) hours as needed. Patient not taking: Reported on 01/11/2019 08/14/18   Kathryne Hitch, MD  Potassium 99 MG TABS Take by mouth.    [provider]  traMADol (ULTRAM) 50 MG tablet TK 1 T PO BID 05/02/18   [provider]   History reviewed. No pertinent past medical history. History reviewed. No pertinent surgical history. History reviewed. No pertinent family history. Social History   Socioeconomic History  .  Marital status: Married    Spouse name: Not on file  . Number of children: Not on file  . Years of education: Not on file  . Highest education level: Not on file  Occupational History  . Not on file  Social Needs  . Financial resource strain: Not on file  . Food insecurity:    Worry: Not on file    Inability: Not on file  . Transportation needs:    Medical: Not on file    Non-medical: Not on file  Tobacco Use  . Smoking status: Former Smoker    Packs/day: 1.00    Types: Cigarettes    Last attempt to quit: 10/12/2018    Years since quitting: 0.2  . Smokeless tobacco: Never Used  . Tobacco comment: took Chantix and then quit  Substance and Sexual Activity  . Alcohol use: Not on file  . Drug use: Not on file  . Sexual activity: Not on file  Lifestyle  . Physical activity:    Days per week: Not on file    Minutes per session: Not on file  . Stress: Not on file  Relationships  . Social connections:    Talks on phone: Not on file    Gets together: Not on file    Attends religious service: Not on file    Active member of club or organization: Not on file    Attends meetings of clubs or organizations: Not on file    Relationship status: Not on file  . Intimate partner violence:    Fear  of current or ex partner: Not on file    Emotionally abused: Not on file    Physically abused: Not on file    Forced sexual activity: Not on file  Other Topics Concern  . Not on file  Social History Narrative  . Not on file   ROS-see HPI   + = positive Constitutional:    weight loss, night sweats, fevers, chills, fatigue, lassitude. HEENT:    headaches, difficulty swallowing, tooth/dental problems, sore throat,       sneezing, itching, ear ache, nasal congestion, post nasal drip, snoring CV:    chest pain, orthopnea, PND, +swelling in lower extremities, anasarca,                                  dizziness, palpitations Resp:   shortness of breath with exertion or at rest.                 productive cough,   non-productive cough, coughing up of blood.              change in color of mucus.  wheezing.   Skin:    rash or lesions. GI:  No-   heartburn, indigestion, abdominal pain, nausea, vomiting, diarrhea,                 change in bowel habits, loss of appetite GU: dysuria, change in color of urine, no urgency or frequency.   flank pain. MS:   joint pain, stiffness, decreased range of motion, back pain. Neuro-     nothing unusual Psych:  change in mood or affect.  depression or anxiety.   memory loss.  OBJ- Physical Exam General- Alert, Oriented, Affect-appropriate, Distress- none acute, +overwweight Skin- rash-none, lesions- none, excoriation- none Lymphadenopathy- none Head- atraumatic            Eyes- Gross vision intact, PERRLA, conjunctivae and secretions clear            Ears- Hearing, canals-normal            Nose- Clear, no-Septal dev, mucus, polyps, erosion, perforation             Throat- Mallampati III-IV , mucosa clear , drainage- none, tonsils- atrophic Neck- flexible , trachea midline, no stridor , thyroid nl, carotid no bruit Chest - symmetrical excursion , unlabored           Heart/CV- RRR , no murmur , no gallop  , no rub, nl s1 s2                           - JVD- none , edema- none, stasis changes- none, varices- none           Lung- clear to P&A, wheeze- none, cough- none , dullness-none, rub- none           Chest wall-  Abd-  Br/ Gen/ Rectal- Not done, not indicated Extrem- cyanosis- none, clubbing, none, atrophy- none, strength- nl Neuro- grossly intact to observation

## 2019-01-11 NOTE — Patient Instructions (Signed)
Order- please schedule unattended home sleep test   Dx OSA  Please calll me about 2 weeks after your sleep test for results and recommendations. If appropriate, we may be able to start treatment before we see you next.

## 2019-01-15 DIAGNOSIS — R0683 Snoring: Secondary | ICD-10-CM | POA: Insufficient documentation

## 2019-01-15 NOTE — Assessment & Plan Note (Signed)
History and exam consistent with OSA, We discussed etiology, medical concerns, testing, treatment, driving responsibility, importance of body weight. Plan- schedule sleep study

## 2019-01-23 ENCOUNTER — Other Ambulatory Visit (INDEPENDENT_AMBULATORY_CARE_PROVIDER_SITE_OTHER): Payer: Self-pay | Admitting: Physician Assistant

## 2019-01-23 NOTE — Telephone Encounter (Signed)
Please advise 

## 2019-01-24 ENCOUNTER — Ambulatory Visit: Payer: No Typology Code available for payment source

## 2019-01-24 ENCOUNTER — Other Ambulatory Visit: Payer: Self-pay

## 2019-01-24 DIAGNOSIS — G4733 Obstructive sleep apnea (adult) (pediatric): Secondary | ICD-10-CM

## 2019-01-26 DIAGNOSIS — G4733 Obstructive sleep apnea (adult) (pediatric): Secondary | ICD-10-CM | POA: Diagnosis not present

## 2019-02-06 ENCOUNTER — Ambulatory Visit (INDEPENDENT_AMBULATORY_CARE_PROVIDER_SITE_OTHER): Payer: 59 | Admitting: Orthopaedic Surgery

## 2019-02-06 ENCOUNTER — Encounter: Payer: Self-pay | Admitting: Orthopaedic Surgery

## 2019-02-06 ENCOUNTER — Other Ambulatory Visit: Payer: Self-pay

## 2019-02-06 DIAGNOSIS — Z9889 Other specified postprocedural states: Secondary | ICD-10-CM | POA: Diagnosis not present

## 2019-02-06 NOTE — Progress Notes (Signed)
The patient is now 5 months status post arthroscopic rotator cuff repair of the right shoulder.  She injured the shoulder on the job in a work-related accident on April 09, 2018 when she was lifting a heavy bag of feed above her head.  She is done well since surgery.  She still has subjective numbness in her right upper extremity and we worked this up with a MRI of the cervical spine as well as nerve conduction studies that both showed no evidence of any nerve impingement.  She is been on Neurontin and that does help.  Uncertain as to why she has persistent numbness and tingling which she now describes in her forearm on the right side in her upper arm.  On exam her range of motion is improved significantly of her right shoulder as well as her strength.  There is still deficits in strength being only 4+ out of 5 and deficits in range of motion in terms of she cannot make it to full abduction or external rotation.  Her internal rotation with adduction is also slightly limited.  At this point she is 17-month postoperative and can be released from our standpoint.  A disability letter and rating including a listing of the restrictions will be forthcoming.  All question concerns were answered addressed.  I have recommended that she see her primary care physician again and potentially have her PCP refer her to a neurologist.  She has had a sleep study recently and I want her to address her numbness with the pulmonologist as well.  She agreed to do so.

## 2019-02-10 ENCOUNTER — Telehealth: Payer: Self-pay | Admitting: *Deleted

## 2019-02-10 ENCOUNTER — Other Ambulatory Visit: Payer: 59

## 2019-02-10 DIAGNOSIS — Z20822 Contact with and (suspected) exposure to covid-19: Secondary | ICD-10-CM

## 2019-02-10 NOTE — Telephone Encounter (Signed)
Contacted pt regarding recent ortho visit, and possible exposure to COVID; she denies SOB, fever, or cough; the pt agrees to testing; she is offered and accepted appointment at [facility] site 02/10/2019 at 1315; address, location, and instructions given that she and all occupants of her vehicle should wear a mask; also explained that results should be ready in 72 hours; she verbalized understanding; orders placed per protocol.

## 2019-02-12 LAB — NOVEL CORONAVIRUS, NAA: SARS-CoV-2, NAA: NOT DETECTED

## 2019-03-06 ENCOUNTER — Telehealth: Payer: Self-pay | Admitting: Orthopaedic Surgery

## 2019-03-06 NOTE — Telephone Encounter (Signed)
Patient called. Is needing copy of records including NCV she had done at [facility], also MRI reports at [facility]. She is coming by to sign release form

## 2019-03-12 ENCOUNTER — Telehealth: Payer: Self-pay | Admitting: Orthopaedic Surgery

## 2019-03-12 NOTE — Telephone Encounter (Signed)
Kimberly Marshall called triage line. She states that the paperwork that she received does not make sense. I explained that this paperwork was completed by an outside source and that I could try to help her. She is curious as to what Dr Trevor Mace plan is to get the patient back to regular work since she is not attending PT, etc. I explained her prior message has been sent to Dr. Trevor Mace assistant and they will let her know once he has addressed.

## 2019-03-12 NOTE — Telephone Encounter (Signed)
Patrice from the [employer] called to state she has called [employer] and they can't explain a form that was filled out and signed by Dr. Ninfa Linden.  Please call her back @ [phone]

## 2019-03-12 NOTE — Telephone Encounter (Signed)
Faxed notes to [place] Claim# [id number]

## 2019-03-12 NOTE — Telephone Encounter (Signed)
I will defer to her disability letter from 6/1.  She can work and the [employer] her disability and restrictions.

## 2019-03-12 NOTE — Telephone Encounter (Signed)
They are asking if we have her out of work? I can't tell They are asking if we DO have her out of work, they are asking why?

## 2019-03-20 ENCOUNTER — Telehealth: Payer: Self-pay | Admitting: Internal Medicine

## 2019-03-20 NOTE — Telephone Encounter (Signed)
Home sleep test showed severe obstructive sleep apnea, averaging 51 apneas/ hour, with drops in blood oxygen level.  I recommend CPAP as discussed at our office visit.  Order- new DME, new CPAP auto 5-20, mask of choice humidifier, supplies, AirView/ card

## 2019-03-20 NOTE — Telephone Encounter (Signed)
Call returned to patient, she is requesting her sleep study results.   CY please advise. Thanks.

## 2019-03-20 NOTE — Telephone Encounter (Signed)
Spoke with patient. She verbalized understanding and wishes to proceed with the CPAP order. However, she currently does not have insurance and will not have insurance until August. She will call us back once her insurance is active so that we can place the order.   Also advised her that she may need to come back the office with her card so that we can scan it into the system and send with her order.

## 2019-04-05 ENCOUNTER — Ambulatory Visit: Payer: 59 | Admitting: Neurology

## 2019-04-12 ENCOUNTER — Ambulatory Visit: Payer: No Typology Code available for payment source | Admitting: Internal Medicine

## 2019-05-01 ENCOUNTER — Ambulatory Visit: Payer: 59 | Admitting: Neurology

## 2019-05-28 ENCOUNTER — Telehealth: Payer: Self-pay | Admitting: Internal Medicine

## 2019-05-28 NOTE — Telephone Encounter (Signed)
Spoke with pt.  She is requesting that sleep study results from 01/2019 be faxed to her new pulmonologist Dr Daiva Eves with [facility].  Results faxed to [phone] per pt request. Nothing further needed at this time.

## 2019-05-31 ENCOUNTER — Ambulatory Visit: Payer: 59 | Admitting: Internal Medicine

## 2020-07-24 IMAGING — CR DG SHOULDER 2+V*R*
3 series · 3 of 3 positions shown · non-contrast
Comparison: None.

CLINICAL DATA: Right shoulder pain for 3 weeks.

EXAM:
RIGHT SHOULDER - 2+ VIEW

[w shoulder grashey right]
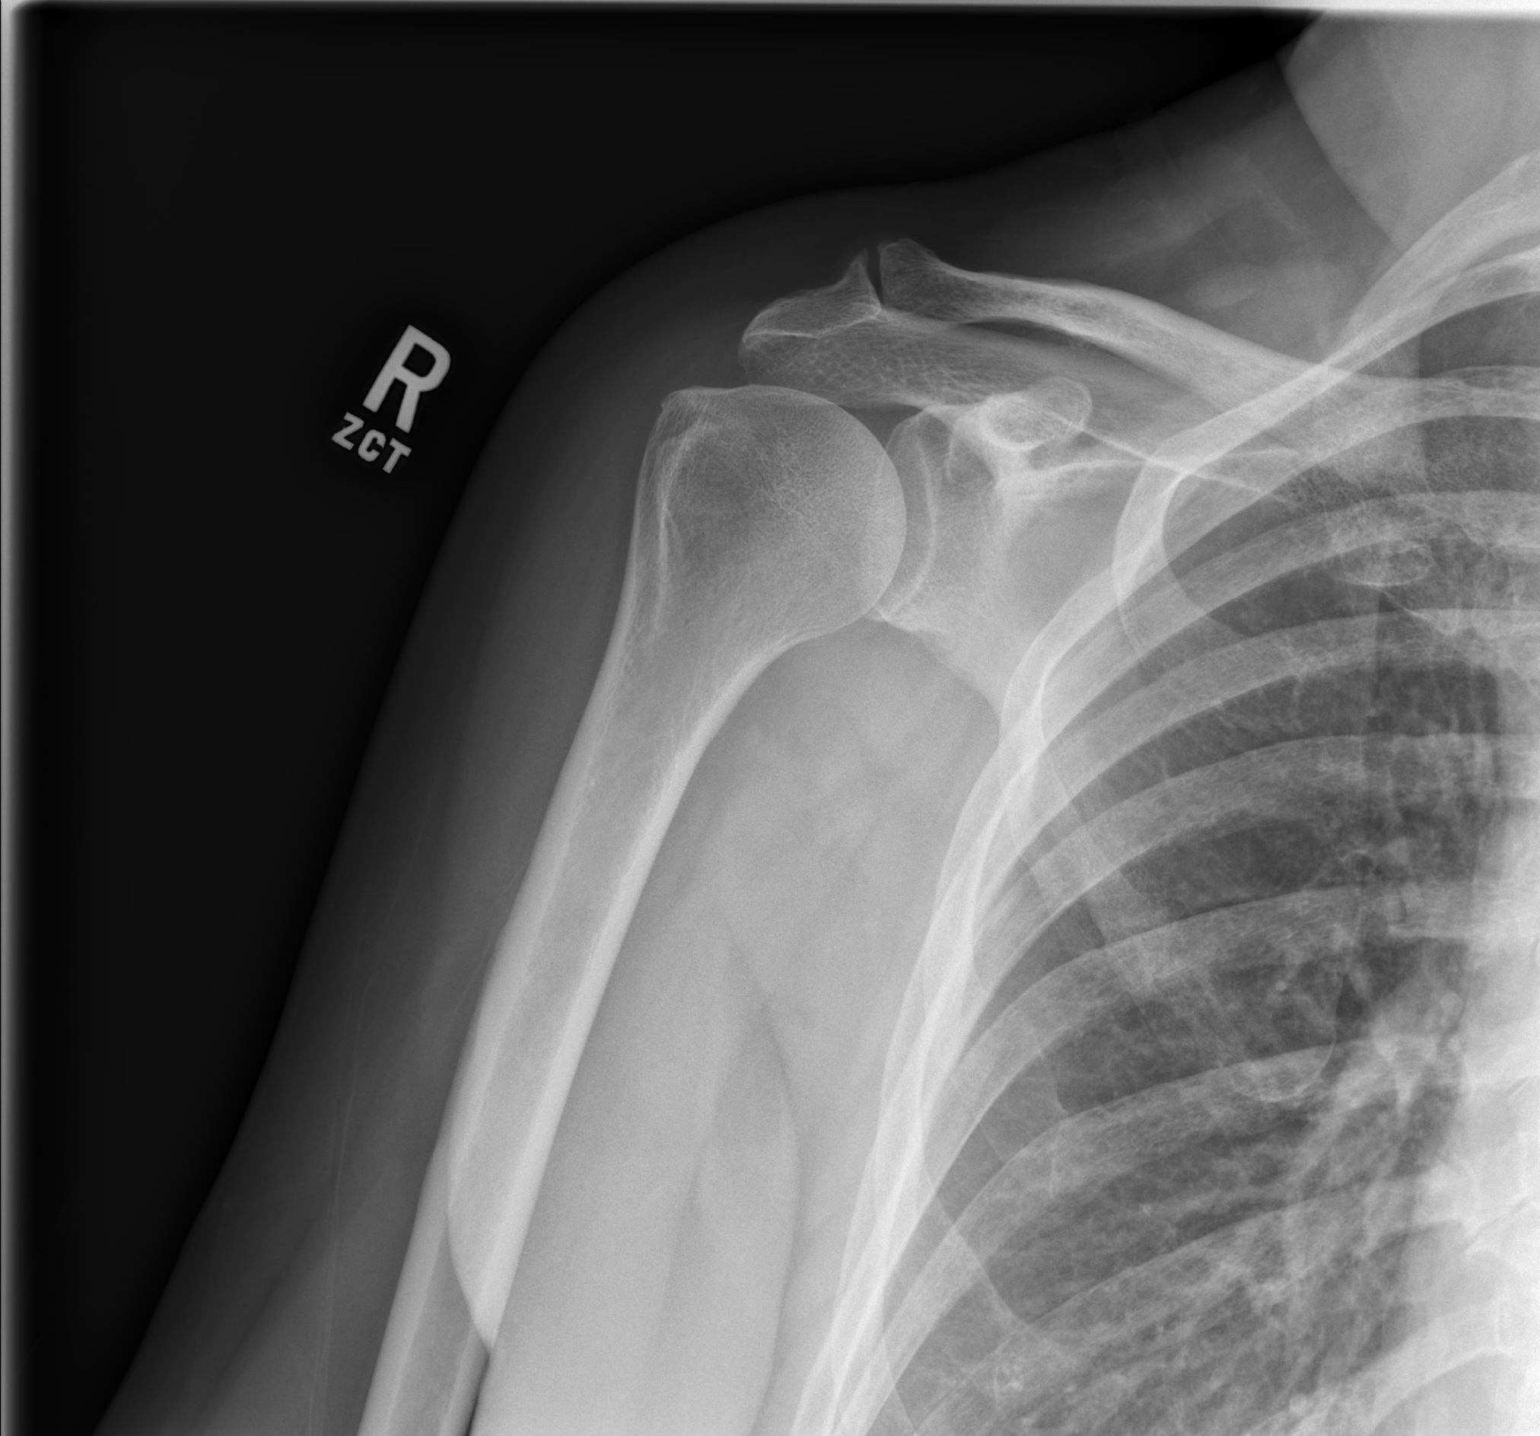

[w shoulder y-view right]
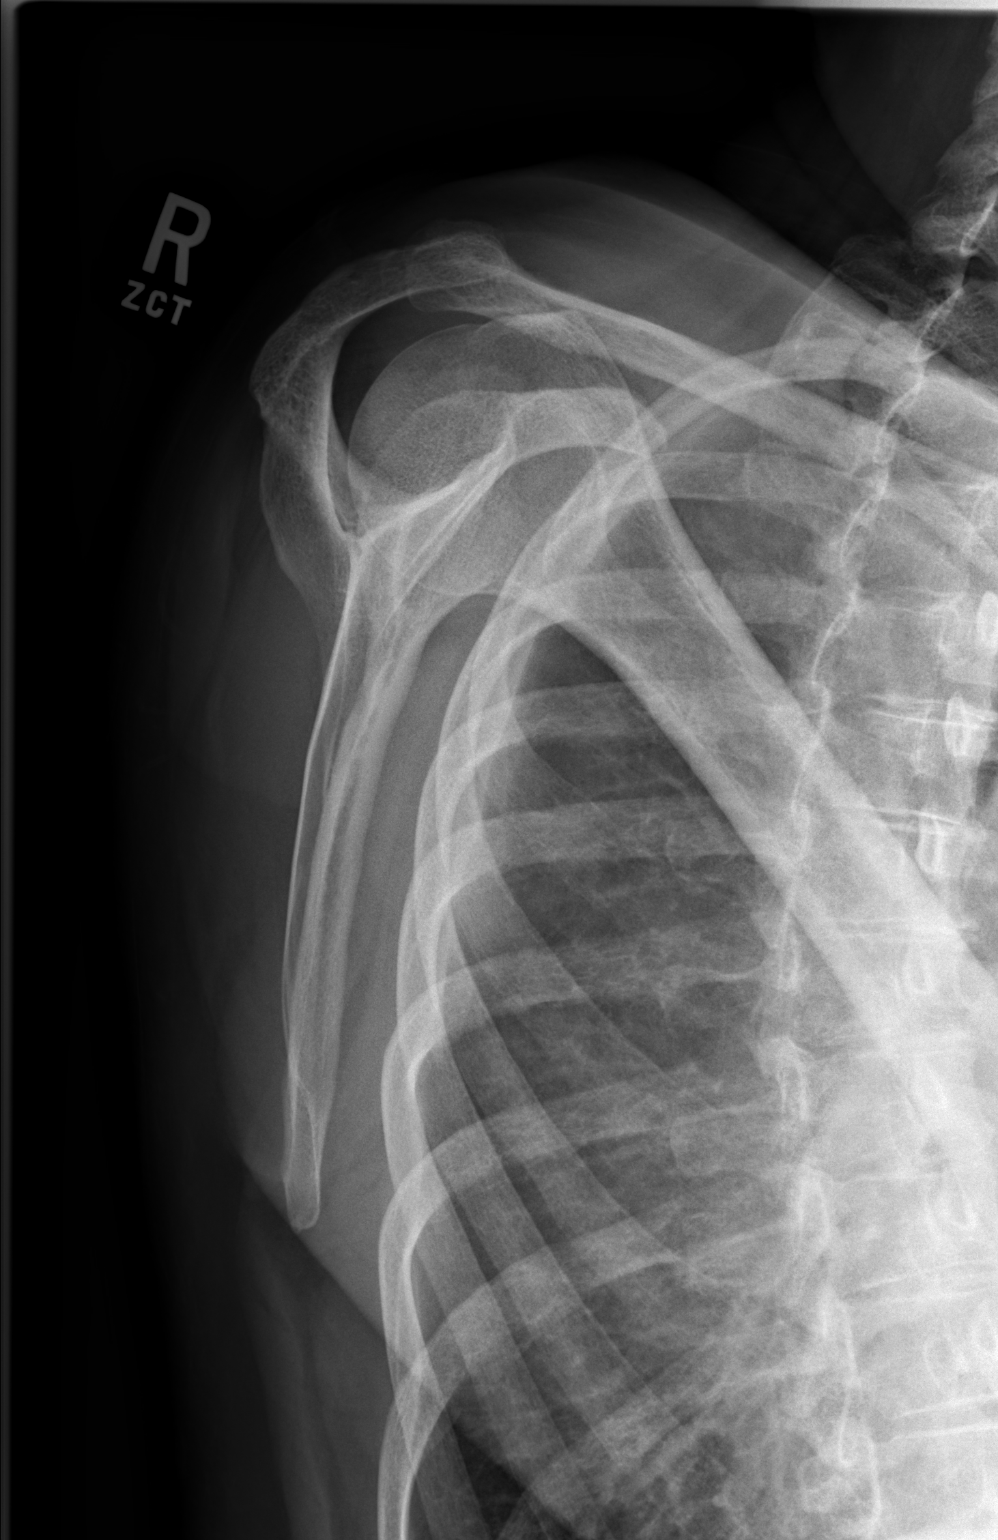

[w shoulder axillary right]
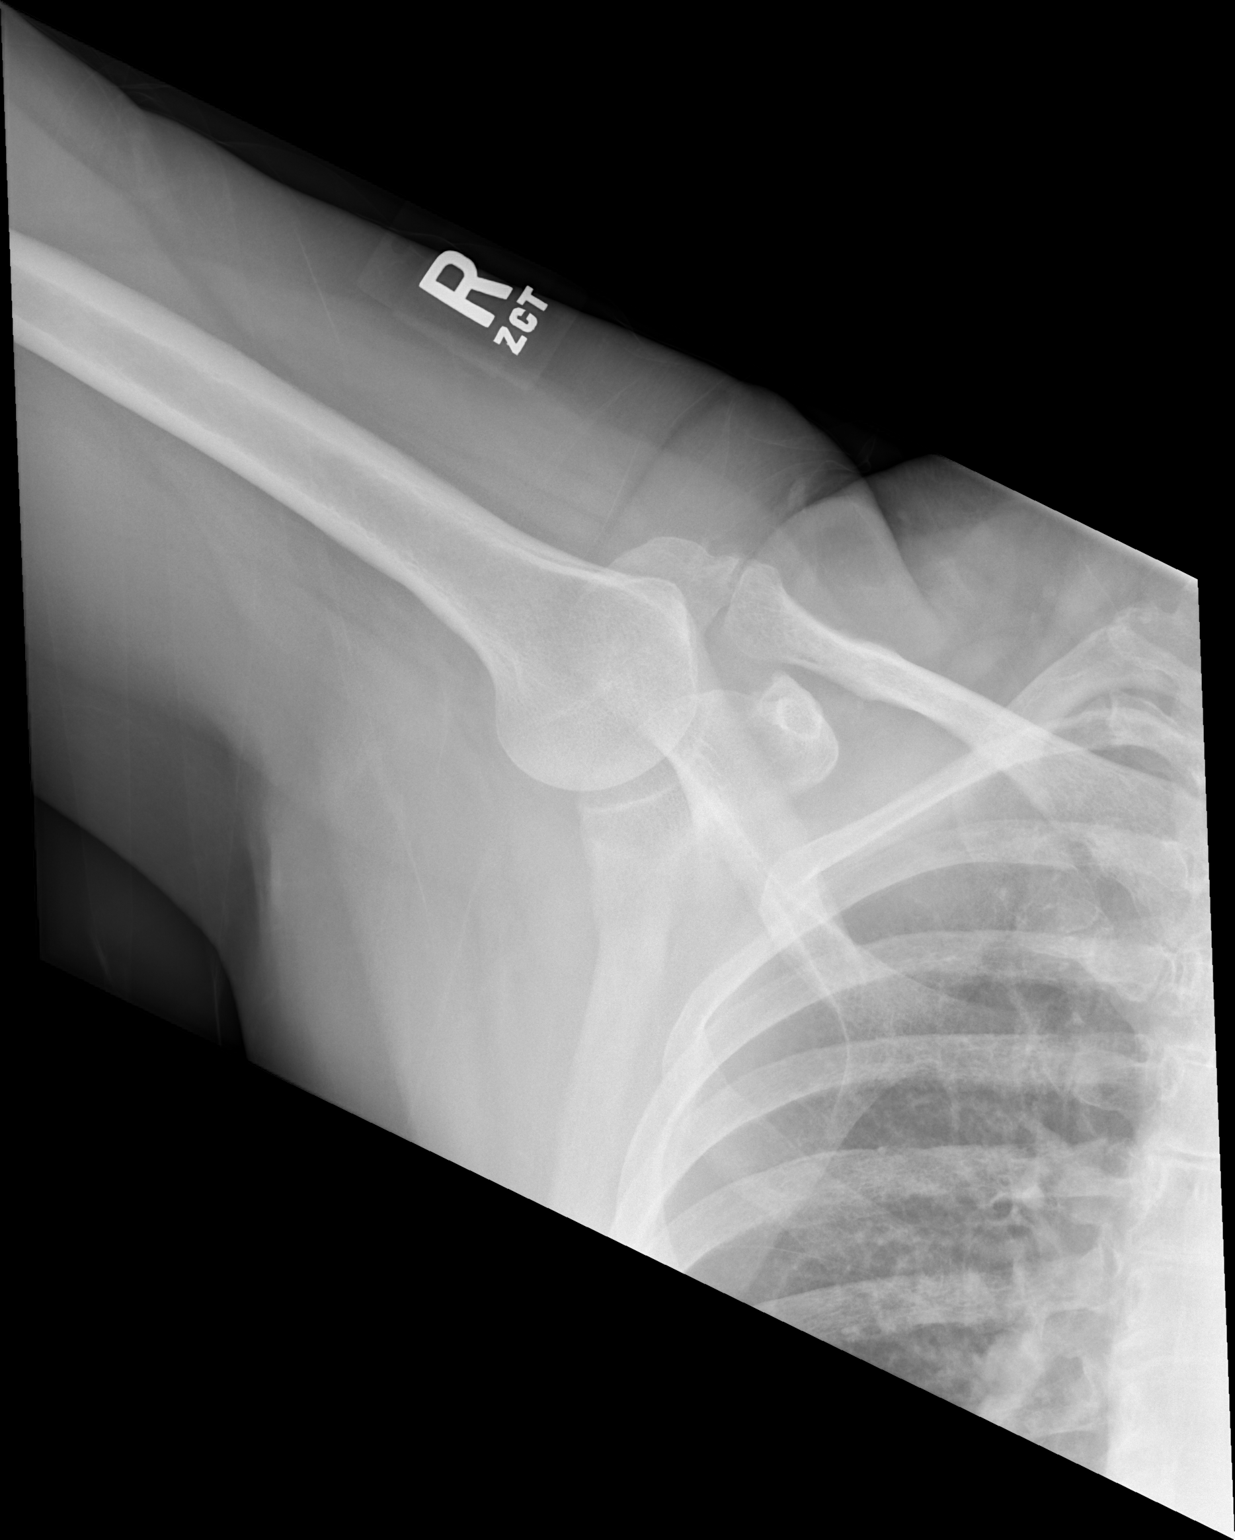

[3 of 3 positions shown; findings below may reference images not displayed]

FINDINGS: There is no evidence of fracture or dislocation. There is no
evidence of arthropathy or other focal bone abnormality. Soft
tissues are unremarkable.
IMPRESSION: Negative.

## 2023-03-30 ENCOUNTER — Other Ambulatory Visit: Payer: Self-pay | Admitting: Family Medicine

## 2023-03-30 DIAGNOSIS — E2839 Other primary ovarian failure: Secondary | ICD-10-CM

## 2023-09-28 ENCOUNTER — Ambulatory Visit
Admission: RE | Admit: 2023-09-28 | Discharge: 2023-09-28 | Disposition: A | Discharge status: Home / Self Care | Payer: Medicaid Other | Source: Ambulatory Visit | Attending: Family Medicine | Admitting: Family Medicine

## 2023-09-28 DIAGNOSIS — E2839 Other primary ovarian failure: Secondary | ICD-10-CM
